# Patient Record
Sex: Male | Born: 1985 | Race: White | Hispanic: No | Marital: Single | Smoking: Never smoker
Health system: Southern US, Community
[De-identification: ages and names within clinical notes are randomized; demographics above are authoritative.]

## PROBLEM LIST (undated history)

## (undated) HISTORY — PX: SHOULDER SURGERY: SHX246

---

## 2013-09-24 ENCOUNTER — Emergency Department
Admission: EM | Admit: 2013-09-24 | Discharge: 2013-09-24 | Disposition: A | Discharge status: Home / Self Care | Payer: BC Managed Care – PPO | Source: Home / Self Care | Attending: Family Medicine | Admitting: Family Medicine

## 2013-09-24 ENCOUNTER — Encounter: Payer: Self-pay | Admitting: Emergency Medicine

## 2013-09-24 DIAGNOSIS — R197 Diarrhea, unspecified: Secondary | ICD-10-CM

## 2013-09-24 DIAGNOSIS — R1011 Right upper quadrant pain: Secondary | ICD-10-CM

## 2013-09-24 DIAGNOSIS — R112 Nausea with vomiting, unspecified: Secondary | ICD-10-CM

## 2013-09-24 LAB — POCT CBC W AUTO DIFF (K'VILLE URGENT CARE)

## 2013-09-24 LAB — POCT URINALYSIS DIP (MANUAL ENTRY)
Bilirubin, UA: NEGATIVE
GLUCOSE UA: NEGATIVE
Ketones, POC UA: NEGATIVE
Leukocytes, UA: NEGATIVE
NITRITE UA: NEGATIVE
PROTEIN UA: NEGATIVE
RBC UA: NEGATIVE
Spec Grav, UA: >=1.03 (ref 1.005–1.03)
UROBILINOGEN UA: 0.2 (ref 0–1)
pH, UA: 6 (ref 5–8)

## 2013-09-24 MED ORDER — DICYCLOMINE HCL 10 MG PO CAPS: ORAL_CAPSULE | ORAL | Status: AC

## 2013-09-24 NOTE — ED Provider Notes (Signed)
CSN: [id number]     Arrival date & time 09/24/13  1320 History   First MD [id number] Contact with Patient 09/24/13 1401     Chief Complaint  Patient presents with  . Abdominal Pain        HPI Comments: Patient complains of 3 to 4 month history of recurring right upper quadrant abdominal pain.  The pain does not radiate.  The discomfort lasts about 20 minutes and occurs several times per week.  The pain can be worse with activity, and improves after drinking water.  The discomfort is worse, and occurs more frequently when he is under increased stress.  He tends to have loose stools alternating with mild constipation, but has seen no blood in his stools (he notes that he has had intermittent mild constipation for about 9 to 10 years).  He also has frequent mild GERD symptoms.  He states that he works at [employer], where he gets free meals and often feels somewhat bloated.  Recently he has removed the bread from his meals at [facility], [facility], [employer] and has noted improvement in his symptoms. Over the past 48 hours he has developed fatigue, chills/sweats with fever to 100.2, nausea with occasional vomiting, watery diarrhea, and increased abdominal cramping.  Denies recent foreign travel, or drinking untreated water in a wilderness environment.   Patient is a 28 y.o. male presenting with cramps. The history is provided by the patient.  Abdominal Cramping This is a chronic problem. Episode onset: 3 to 4 months ago. The problem occurs every several days. The problem has been gradually worsening. Associated symptoms include abdominal pain and headaches. Pertinent negatives include no chest pain and no shortness of breath. The symptoms are aggravated by stress. Relieved by: drinking water. He has tried nothing for the symptoms.    History reviewed. No pertinent past medical history. Past Surgical History  Procedure Laterality Date  . Shoulder surgery     Family History  Problem Relation Age of Onset  .  Diabetes Mother    History  Substance Use Topics  . Smoking status: Never Smoker   . Smokeless tobacco: Never Used  . Alcohol Use: Yes    Review of Systems  Constitutional: Positive for fever, chills, appetite change and fatigue. Negative for diaphoresis, activity change and unexpected weight change.  HENT: Negative for congestion.   Eyes: Negative.   Respiratory: Negative.  Negative for shortness of breath.   Cardiovascular: Negative for chest pain.  Gastrointestinal: Positive for nausea, vomiting, abdominal pain, diarrhea and constipation. Negative for blood in stool and abdominal distention.  Endocrine: Negative.   Genitourinary: Negative.   Musculoskeletal: Negative.   Skin: Negative.   Neurological: Positive for headaches.      Allergies  Review of patient's allergies indicates no known allergies.  Home Medications  No current outpatient prescriptions on file. BP 122/85  Pulse 80  Temp(Src) 98 F (36.7 C) (Oral)  Resp 16  Ht 6' (1.829 m)  Wt 228 lb (103.42 kg)  BMI 30.92 kg/m2  SpO2 100% Physical Exam Nursing notes and Vital Signs reviewed. Appearance:  Patient appears stated age, and in no acute distress.  Patient is obese (BMI 30.9) Eyes:  Pupils are equal, round, and reactive to light and accomodation.  Extraocular movement is intact.  Conjunctivae are not inflamed  Ears:  Canals normal.  Tympanic membranes normal.  Nose: Normal turbinates.  No sinus tenderness.    Pharynx:  Normal Neck:  Supple.  No adenopathy or thyromegaly  Lungs:  Clear to auscultation.  Breath sounds are equal.  Heart:  Regular rate and rhythm without murmurs, rubs, or gallops.  Abdomen:  Mild/minimal peri-umbilical tenderness without masses or hepatosplenomegaly.  Bowel sounds are present.  No CVA or flank tenderness.  Extremities:  No edema.  No calf tenderness Skin:  No rash present.   ED Course  Procedures  none    Labs Reviewed  POCT URINALYSIS DIP (MANUAL ENTRY):  SG >=  1.030, otherwise negative  POCT CBC W AUTO DIFF ([facility]):  WBC 9.2; LY 21.3; MO 10.1; GR 68.6; Hgb 14.8; Platelets 196          MDM   Final diagnoses:  Abdominal pain, right upper quadrant; suspect underlying IBS  Nausea with vomiting; suspect acute viral gastroenteritis  Diarrhea    Begin trial of Bentyl. Begin a BRAT diet until loose stools and nausea resolve (use rice cakes instead of toast), then gradually advance to a regular diet.  Try eliminating all bread products.   If symptoms become significantly worse during the night or over the weekend, proceed to the local emergency room.  Arrange follow-up appt with gastroenterologist.    Lattie Haw, MD 09/26/13 [phone]

## 2013-09-24 NOTE — ED Notes (Signed)
Christopher Werner c/o right abdominal pain, intermittent about 1/week for about 3-4 months. He vomits some mornings with the pain. The pain has become more frequent and worse in severity. This past weekend he feels he has a "GI bug" of N/V/D that is starting to resolve. Pain described as a "knot" in right side.

## 2013-09-24 NOTE — Discharge Instructions (Signed)
Begin a BRAT diet until loose stools and nausea resolve (use rice cakes instead of toast), then gradually advance to a regular diet.  Try eliminating all bread products.   If symptoms become significantly worse during the night or over the weekend, proceed to the local emergency room.    Irritable Bowel Syndrome Irritable Bowel Syndrome (IBS) is caused by a disturbance of normal bowel function. Other terms used are spastic colon, mucous colitis, and irritable colon. It does not require surgery, nor does it lead to cancer. There is no cure for IBS. But with proper diet, stress reduction, and medication, you will find that your problems (symptoms) will gradually disappear or improve. IBS is a common digestive disorder. It usually appears in late adolescence or early adulthood. Women develop it twice as often as men. CAUSES  After food has been digested and absorbed in the small intestine, waste material is moved into the colon (large intestine). In the colon, water and salts are absorbed from the undigested products coming from the small intestine. The remaining residue, or fecal material, is held for elimination. Under normal circumstances, gentle, rhythmic contractions on the bowel walls push the fecal material along the colon towards the rectum. In IBS, however, these contractions are irregular and poorly coordinated. The fecal material is either retained too long, resulting in constipation, or expelled too soon, producing diarrhea. SYMPTOMS  The most common symptom of IBS is pain. It is typically in the lower left side of the belly (abdomen). But it may occur anywhere in the abdomen. It can be felt as heartburn, backache, or even as a dull pain in the arms or shoulders. The pain comes from excessive bowel-muscle spasms and from the buildup of gas and fecal material in the colon. This pain:  Can range from sharp belly (abdominal) cramps to a dull, continuous ache.  Usually worsens soon after  eating.  Is typically relieved by having a bowel movement or passing gas. Abdominal pain is usually accompanied by constipation. But it may also produce diarrhea. The diarrhea typically occurs right after a meal or upon arising in the morning. The stools are typically soft and watery. They are often flecked with secretions (mucus). Other symptoms of IBS include:  Bloating.  Loss of appetite.  Heartburn.  Feeling sick to your stomach (nausea).  Belching  Vomiting  Gas. IBS may also cause a number of symptoms that are unrelated to the digestive system:  Fatigue.  Headaches.  Anxiety  Shortness of breath  Difficulty in concentrating.  Dizziness. These symptoms tend to come and go. DIAGNOSIS  The symptoms of IBS closely mimic the symptoms of other, more serious digestive disorders. So your caregiver may wish to perform a variety of additional tests to exclude these disorders. He/she wants to be certain of learning what is wrong (diagnosis). The nature and purpose of each test will be explained to you. TREATMENT A number of medications are available to help correct bowel function and/or relieve bowel spasms and abdominal pain. Among the drugs available are:  Mild, non-irritating laxatives for severe constipation and to help restore normal bowel habits.  Specific anti-diarrheal medications to treat severe or prolonged diarrhea.  Anti-spasmodic agents to relieve intestinal cramps.  Your caregiver may also decide to treat you with a mild tranquilizer or sedative during unusually stressful periods in your life. The important thing to remember is that if any drug is prescribed for you, make sure that you take it exactly as directed. Make sure that your  caregiver knows how well it worked for you. HOME CARE INSTRUCTIONS   Avoid foods that are high in fat or oils. Some examples JYN:WGNFAare:heavy cream, butter, frankfurters, sausage, and other fatty meats.  Avoid foods that have a  laxative effect, such as fruit, fruit juice, and dairy products.  Cut out carbonated drinks, chewing gum, and "gassy" foods, such as beans and cabbage. This may help relieve bloating and belching.  Bran taken with plenty of liquids may help relieve constipation.  Keep track of what foods seem to trigger your symptoms.  Avoid emotionally charged situations or circumstances that produce anxiety.  Start or continue exercising.  Get plenty of rest and sleep. MAKE SURE YOU:   Understand these instructions.  Will watch your condition.  Will get help right away if you are not doing well or get worse. Document Released: 07/19/2005 Document Revised: 10/11/2011 Document Reviewed: 03/08/2008 [facility] Patient Information 2014 [employer] [place].

## 2015-11-03 DIAGNOSIS — R2241 Localized swelling, mass and lump, right lower limb: Secondary | ICD-10-CM | POA: Diagnosis not present

## 2015-11-10 DIAGNOSIS — F321 Major depressive disorder, single episode, moderate: Secondary | ICD-10-CM | POA: Diagnosis not present

## 2015-11-10 DIAGNOSIS — F419 Anxiety disorder, unspecified: Secondary | ICD-10-CM | POA: Diagnosis not present

## 2015-11-17 DIAGNOSIS — R2241 Localized swelling, mass and lump, right lower limb: Secondary | ICD-10-CM | POA: Diagnosis not present

## 2015-11-17 DIAGNOSIS — L720 Epidermal cyst: Secondary | ICD-10-CM | POA: Diagnosis not present

## 2016-07-21 DIAGNOSIS — M25519 Pain in unspecified shoulder: Secondary | ICD-10-CM | POA: Diagnosis not present

## 2016-07-21 DIAGNOSIS — M25511 Pain in right shoulder: Secondary | ICD-10-CM | POA: Diagnosis not present

## 2016-07-21 DIAGNOSIS — F321 Major depressive disorder, single episode, moderate: Secondary | ICD-10-CM | POA: Diagnosis not present

## 2016-07-21 DIAGNOSIS — H9201 Otalgia, right ear: Secondary | ICD-10-CM | POA: Diagnosis not present

## 2016-08-09 DIAGNOSIS — M7551 Bursitis of right shoulder: Secondary | ICD-10-CM | POA: Diagnosis not present

## 2016-09-29 DIAGNOSIS — J101 Influenza due to other identified influenza virus with other respiratory manifestations: Secondary | ICD-10-CM | POA: Diagnosis not present

## 2016-09-29 DIAGNOSIS — R509 Fever, unspecified: Secondary | ICD-10-CM | POA: Diagnosis not present

## 2017-01-13 DIAGNOSIS — J020 Streptococcal pharyngitis: Secondary | ICD-10-CM | POA: Diagnosis not present

## 2017-05-07 DIAGNOSIS — K625 Hemorrhage of anus and rectum: Secondary | ICD-10-CM | POA: Diagnosis not present

## 2017-05-07 DIAGNOSIS — R1084 Generalized abdominal pain: Secondary | ICD-10-CM | POA: Diagnosis not present

## 2017-05-24 DIAGNOSIS — Z23 Encounter for immunization: Secondary | ICD-10-CM | POA: Diagnosis not present

## 2017-09-14 DIAGNOSIS — R1111 Vomiting without nausea: Secondary | ICD-10-CM | POA: Diagnosis not present

## 2017-09-14 DIAGNOSIS — J069 Acute upper respiratory infection, unspecified: Secondary | ICD-10-CM | POA: Diagnosis not present

## 2017-10-25 DIAGNOSIS — J0110 Acute frontal sinusitis, unspecified: Secondary | ICD-10-CM | POA: Diagnosis not present

## 2018-01-11 DIAGNOSIS — M25561 Pain in right knee: Secondary | ICD-10-CM | POA: Diagnosis not present

## 2018-01-23 DIAGNOSIS — L03116 Cellulitis of left lower limb: Secondary | ICD-10-CM | POA: Diagnosis not present

## 2018-04-24 DIAGNOSIS — F0781 Postconcussional syndrome: Secondary | ICD-10-CM | POA: Diagnosis not present

## 2018-04-25 ENCOUNTER — Other Ambulatory Visit: Payer: Self-pay | Admitting: Family Medicine

## 2018-04-25 ENCOUNTER — Ambulatory Visit
Admission: RE | Admit: 2018-04-25 | Discharge: 2018-04-25 | Disposition: A | Discharge status: Home / Self Care | Payer: BLUE CROSS/BLUE SHIELD | Source: Ambulatory Visit | Attending: Family Medicine | Admitting: Family Medicine

## 2018-04-25 DIAGNOSIS — S060X0A Concussion without loss of consciousness, initial encounter: Secondary | ICD-10-CM

## 2018-04-25 DIAGNOSIS — R410 Disorientation, unspecified: Secondary | ICD-10-CM | POA: Diagnosis not present

## 2018-05-10 DIAGNOSIS — Z23 Encounter for immunization: Secondary | ICD-10-CM | POA: Diagnosis not present

## 2018-06-21 DIAGNOSIS — F411 Generalized anxiety disorder: Secondary | ICD-10-CM | POA: Diagnosis not present

## 2018-06-21 DIAGNOSIS — F329 Major depressive disorder, single episode, unspecified: Secondary | ICD-10-CM | POA: Diagnosis not present

## 2018-07-19 DIAGNOSIS — F329 Major depressive disorder, single episode, unspecified: Secondary | ICD-10-CM | POA: Diagnosis not present

## 2018-07-19 DIAGNOSIS — F411 Generalized anxiety disorder: Secondary | ICD-10-CM | POA: Diagnosis not present

## 2018-07-19 DIAGNOSIS — E663 Overweight: Secondary | ICD-10-CM | POA: Diagnosis not present

## 2019-01-17 DIAGNOSIS — F329 Major depressive disorder, single episode, unspecified: Secondary | ICD-10-CM | POA: Diagnosis not present

## 2019-01-17 DIAGNOSIS — F411 Generalized anxiety disorder: Secondary | ICD-10-CM | POA: Diagnosis not present

## 2019-05-04 DIAGNOSIS — Z23 Encounter for immunization: Secondary | ICD-10-CM | POA: Diagnosis not present

## 2019-05-14 DIAGNOSIS — F411 Generalized anxiety disorder: Secondary | ICD-10-CM | POA: Diagnosis not present

## 2019-05-14 DIAGNOSIS — F329 Major depressive disorder, single episode, unspecified: Secondary | ICD-10-CM | POA: Diagnosis not present

## 2019-08-21 DIAGNOSIS — R05 Cough: Secondary | ICD-10-CM | POA: Diagnosis not present

## 2019-08-21 DIAGNOSIS — Z03818 Encounter for observation for suspected exposure to other biological agents ruled out: Secondary | ICD-10-CM | POA: Diagnosis not present

## 2019-08-21 DIAGNOSIS — R110 Nausea: Secondary | ICD-10-CM | POA: Diagnosis not present

## 2019-08-21 DIAGNOSIS — R1032 Left lower quadrant pain: Secondary | ICD-10-CM | POA: Diagnosis not present

## 2019-08-22 ENCOUNTER — Ambulatory Visit
Admission: RE | Admit: 2019-08-22 | Discharge: 2019-08-22 | Disposition: A | Discharge status: Home / Self Care | Payer: Self-pay | Source: Ambulatory Visit | Attending: Physician Assistant | Admitting: Physician Assistant

## 2019-08-22 ENCOUNTER — Other Ambulatory Visit: Payer: Self-pay

## 2019-08-22 ENCOUNTER — Other Ambulatory Visit: Payer: Self-pay | Admitting: Physician Assistant

## 2019-08-22 DIAGNOSIS — R05 Cough: Secondary | ICD-10-CM

## 2019-08-22 DIAGNOSIS — R059 Cough, unspecified: Secondary | ICD-10-CM

## 2020-07-01 DIAGNOSIS — Z13220 Encounter for screening for lipoid disorders: Secondary | ICD-10-CM | POA: Diagnosis not present

## 2020-07-01 DIAGNOSIS — F411 Generalized anxiety disorder: Secondary | ICD-10-CM | POA: Diagnosis not present

## 2020-07-01 DIAGNOSIS — F1290 Cannabis use, unspecified, uncomplicated: Secondary | ICD-10-CM | POA: Diagnosis not present

## 2020-07-01 DIAGNOSIS — E663 Overweight: Secondary | ICD-10-CM | POA: Diagnosis not present

## 2020-07-01 DIAGNOSIS — Z131 Encounter for screening for diabetes mellitus: Secondary | ICD-10-CM | POA: Diagnosis not present

## 2020-07-01 DIAGNOSIS — F329 Major depressive disorder, single episode, unspecified: Secondary | ICD-10-CM | POA: Diagnosis not present

## 2020-07-20 IMAGING — CR DG CHEST 2V
2 series · 2 of 2 positions shown · non-contrast
Comparison: No priors.

CLINICAL DATA: 33-year-old male with history of dry cough for 1
month.

EXAM:
CHEST - 2 VIEW

[w chest pa]
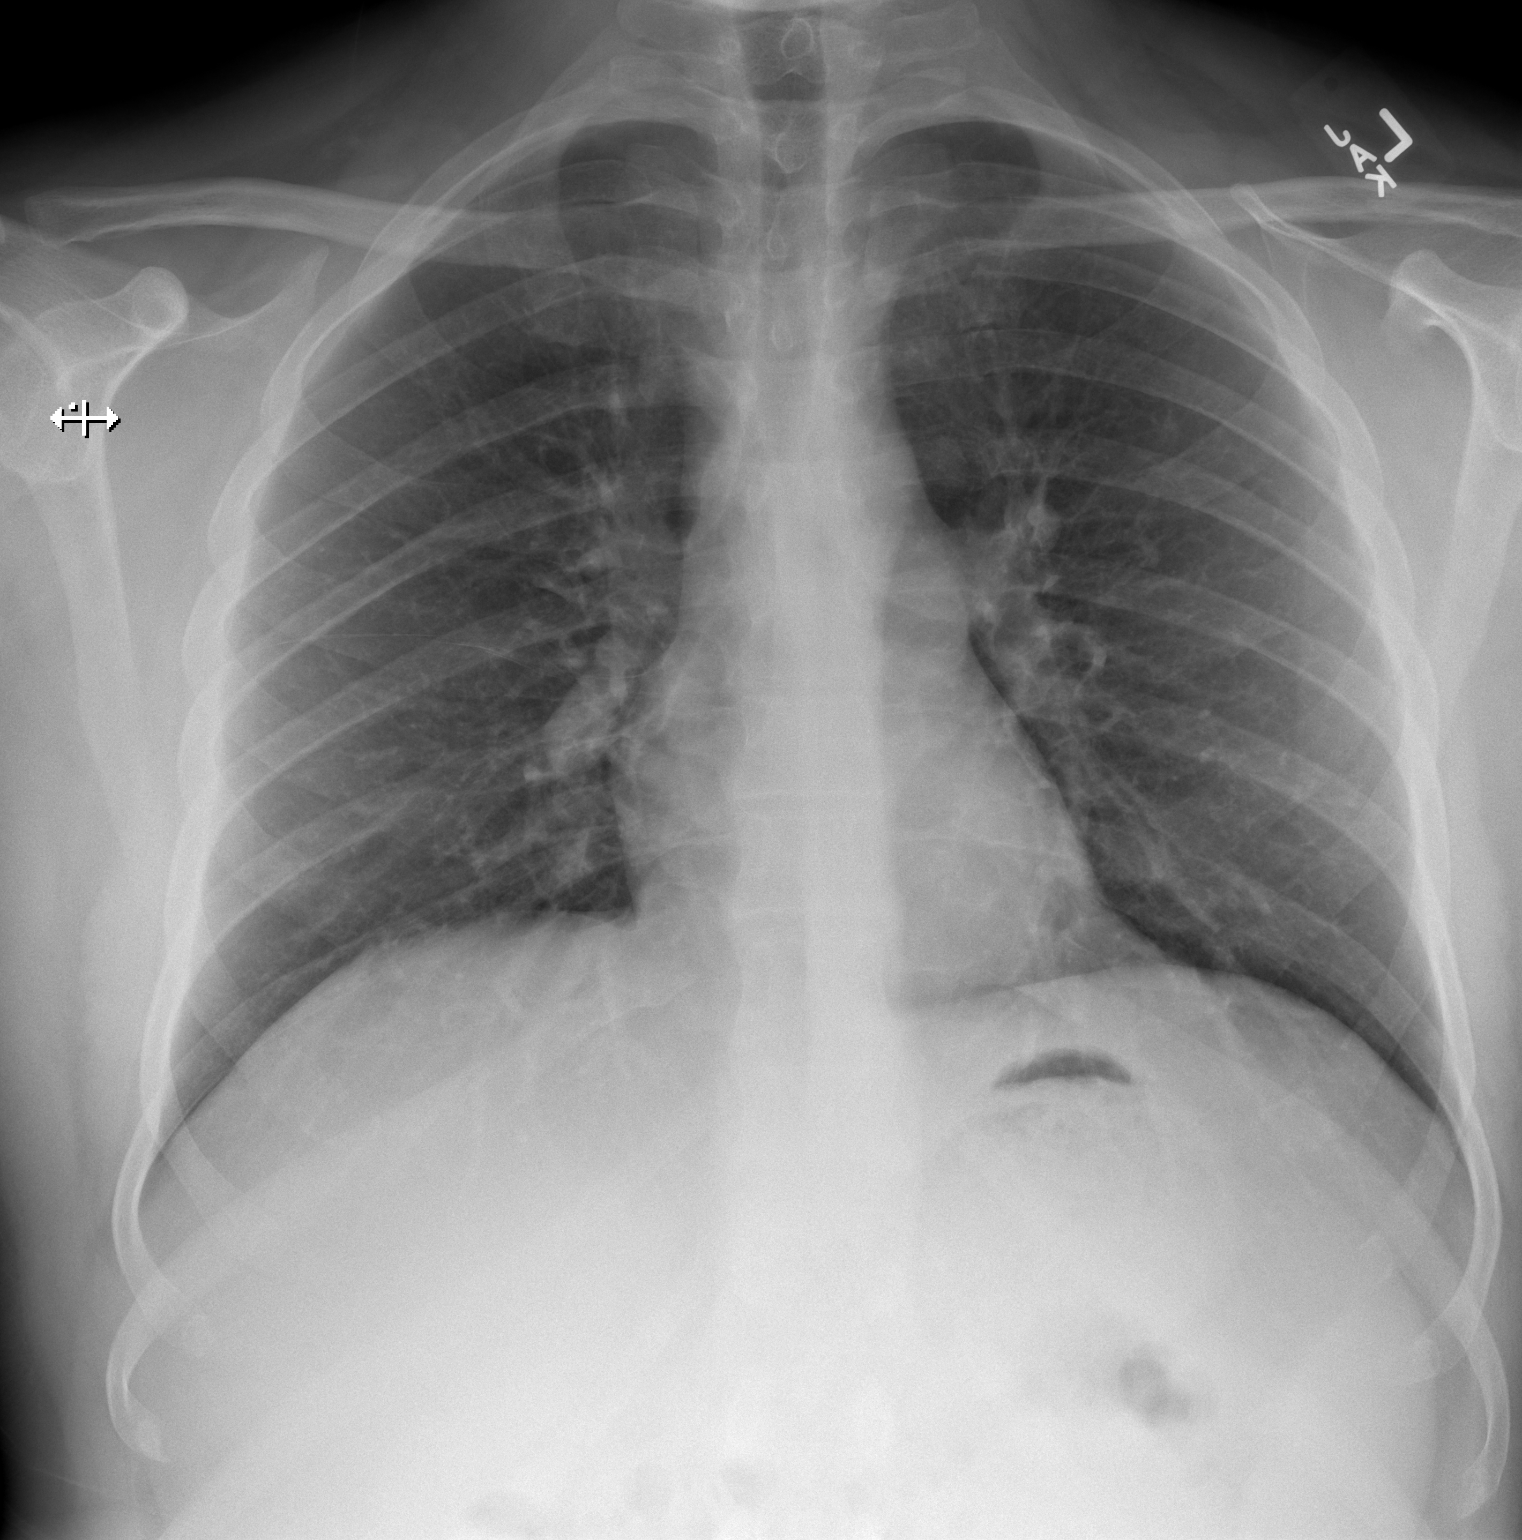

[w chest lat]
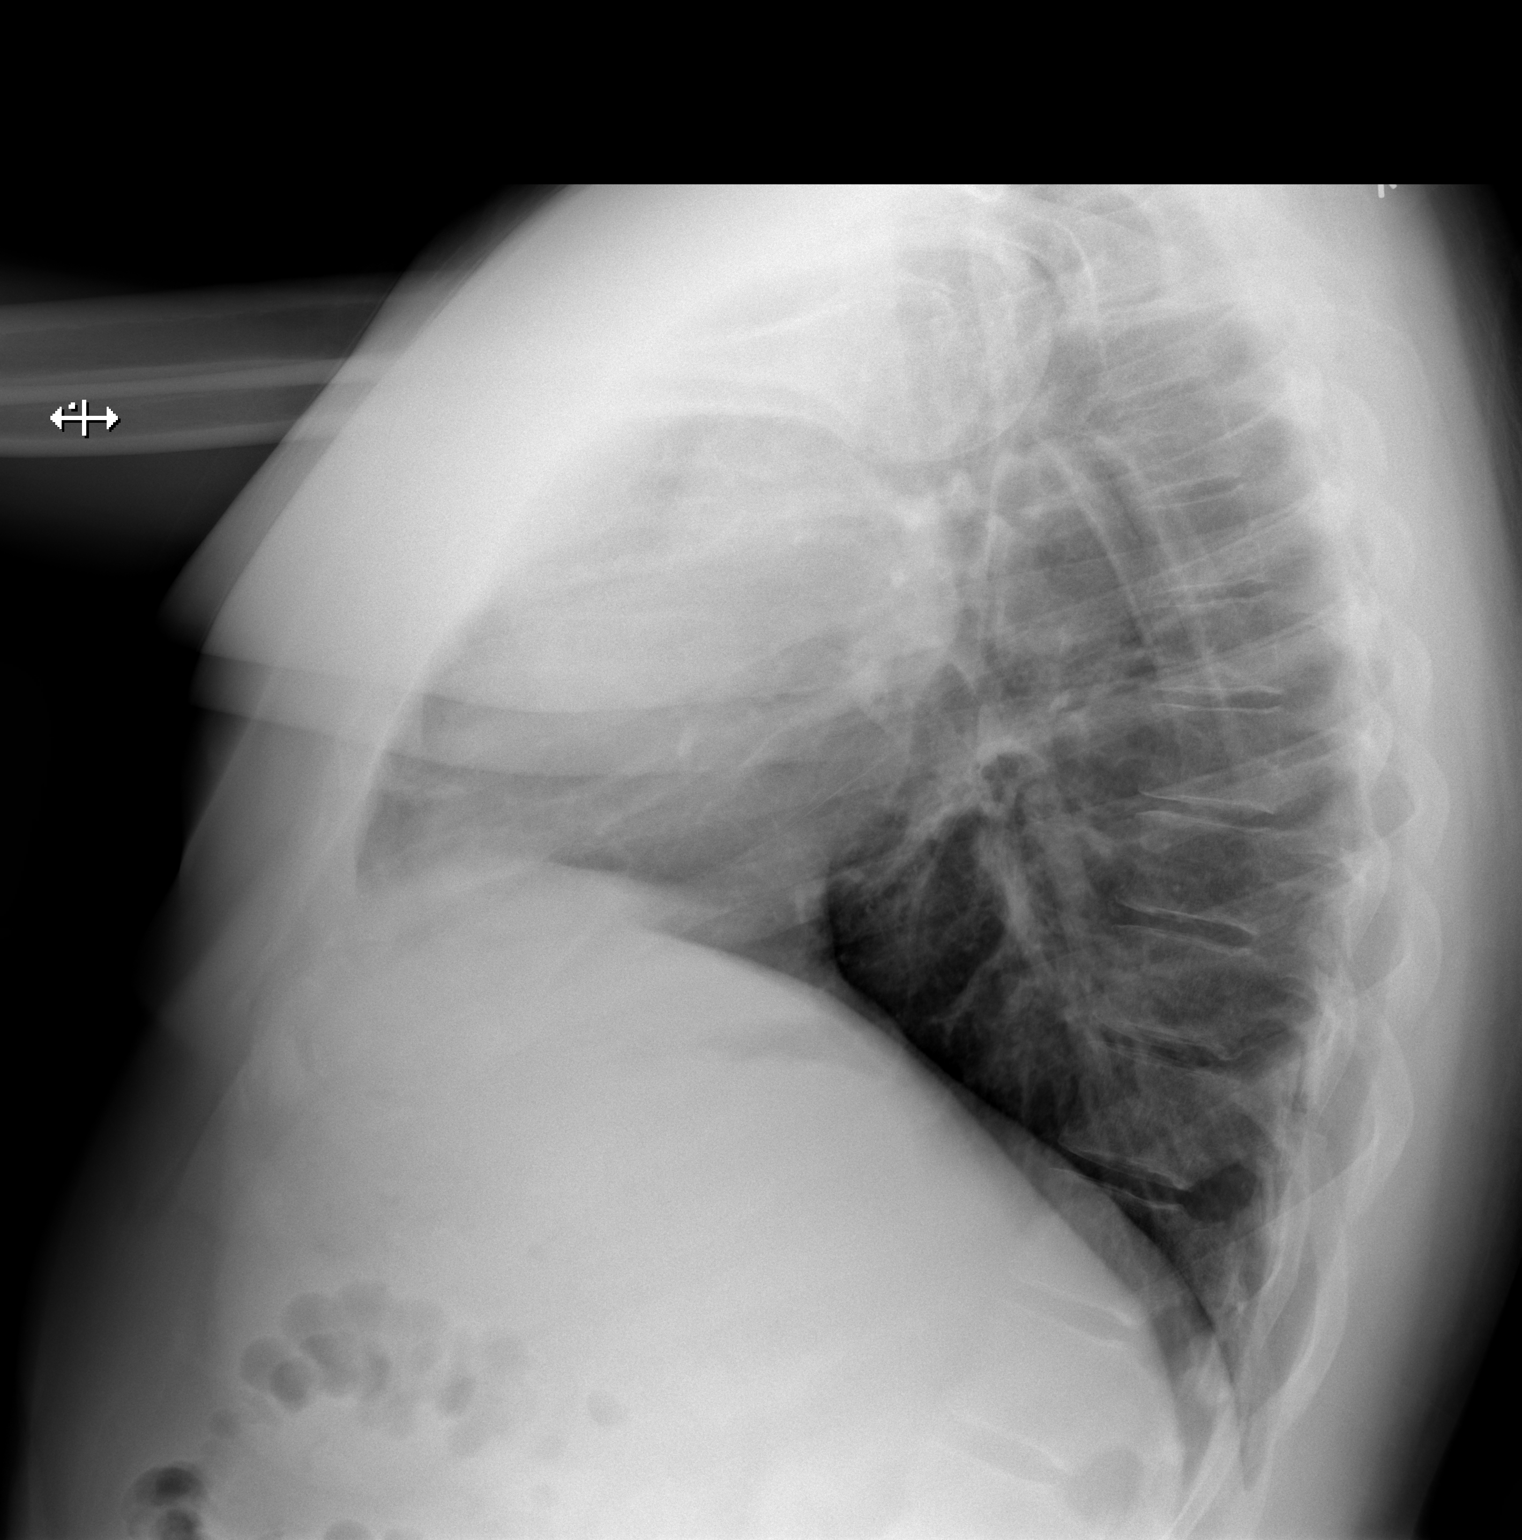

[2 of 2 positions shown; findings below may reference images not displayed]

FINDINGS: Lung volumes are normal. No consolidative airspace disease. No
pleural effusions. No pneumothorax. No pulmonary nodule or mass
noted. Pulmonary vasculature and the cardiomediastinal silhouette
are within normal limits.
IMPRESSION: No radiographic evidence of acute cardiopulmonary disease.

## 2020-11-17 DIAGNOSIS — F4320 Adjustment disorder, unspecified: Secondary | ICD-10-CM | POA: Diagnosis not present

## 2020-11-26 DIAGNOSIS — F4320 Adjustment disorder, unspecified: Secondary | ICD-10-CM | POA: Diagnosis not present

## 2020-12-01 DIAGNOSIS — F4320 Adjustment disorder, unspecified: Secondary | ICD-10-CM | POA: Diagnosis not present

## 2020-12-08 DIAGNOSIS — F4320 Adjustment disorder, unspecified: Secondary | ICD-10-CM | POA: Diagnosis not present

## 2020-12-18 DIAGNOSIS — F4320 Adjustment disorder, unspecified: Secondary | ICD-10-CM | POA: Diagnosis not present

## 2020-12-24 DIAGNOSIS — F4320 Adjustment disorder, unspecified: Secondary | ICD-10-CM | POA: Diagnosis not present

## 2020-12-30 DIAGNOSIS — F4320 Adjustment disorder, unspecified: Secondary | ICD-10-CM | POA: Diagnosis not present

## 2021-01-02 DIAGNOSIS — F411 Generalized anxiety disorder: Secondary | ICD-10-CM | POA: Diagnosis not present

## 2021-01-02 DIAGNOSIS — E663 Overweight: Secondary | ICD-10-CM | POA: Diagnosis not present

## 2021-01-02 DIAGNOSIS — F329 Major depressive disorder, single episode, unspecified: Secondary | ICD-10-CM | POA: Diagnosis not present

## 2021-01-08 DIAGNOSIS — F4320 Adjustment disorder, unspecified: Secondary | ICD-10-CM | POA: Diagnosis not present

## 2021-01-22 DIAGNOSIS — F4320 Adjustment disorder, unspecified: Secondary | ICD-10-CM | POA: Diagnosis not present

## 2021-02-16 DIAGNOSIS — F4320 Adjustment disorder, unspecified: Secondary | ICD-10-CM | POA: Diagnosis not present

## 2021-03-02 DIAGNOSIS — F4320 Adjustment disorder, unspecified: Secondary | ICD-10-CM | POA: Diagnosis not present

## 2021-03-19 DIAGNOSIS — F4320 Adjustment disorder, unspecified: Secondary | ICD-10-CM | POA: Diagnosis not present

## 2021-04-28 DIAGNOSIS — F4320 Adjustment disorder, unspecified: Secondary | ICD-10-CM | POA: Diagnosis not present

## 2021-05-07 DIAGNOSIS — F4320 Adjustment disorder, unspecified: Secondary | ICD-10-CM | POA: Diagnosis not present

## 2021-05-21 DIAGNOSIS — F4320 Adjustment disorder, unspecified: Secondary | ICD-10-CM | POA: Diagnosis not present

## 2021-06-04 DIAGNOSIS — F4320 Adjustment disorder, unspecified: Secondary | ICD-10-CM | POA: Diagnosis not present

## 2021-07-06 DIAGNOSIS — F329 Major depressive disorder, single episode, unspecified: Secondary | ICD-10-CM | POA: Diagnosis not present

## 2021-07-06 DIAGNOSIS — F411 Generalized anxiety disorder: Secondary | ICD-10-CM | POA: Diagnosis not present

## 2021-07-06 DIAGNOSIS — R0683 Snoring: Secondary | ICD-10-CM | POA: Diagnosis not present

## 2021-08-11 DIAGNOSIS — G4733 Obstructive sleep apnea (adult) (pediatric): Secondary | ICD-10-CM | POA: Diagnosis not present

## 2021-09-01 DIAGNOSIS — R1084 Generalized abdominal pain: Secondary | ICD-10-CM | POA: Diagnosis not present

## 2021-09-01 DIAGNOSIS — Z03818 Encounter for observation for suspected exposure to other biological agents ruled out: Secondary | ICD-10-CM | POA: Diagnosis not present

## 2021-09-01 DIAGNOSIS — R059 Cough, unspecified: Secondary | ICD-10-CM | POA: Diagnosis not present

## 2021-09-01 DIAGNOSIS — J189 Pneumonia, unspecified organism: Secondary | ICD-10-CM | POA: Diagnosis not present

## 2021-09-01 DIAGNOSIS — J029 Acute pharyngitis, unspecified: Secondary | ICD-10-CM | POA: Diagnosis not present

## 2021-09-01 DIAGNOSIS — R509 Fever, unspecified: Secondary | ICD-10-CM | POA: Diagnosis not present

## 2021-09-01 DIAGNOSIS — R052 Subacute cough: Secondary | ICD-10-CM | POA: Diagnosis not present

## 2021-09-01 DIAGNOSIS — J019 Acute sinusitis, unspecified: Secondary | ICD-10-CM | POA: Diagnosis not present

## 2021-09-23 DIAGNOSIS — G4733 Obstructive sleep apnea (adult) (pediatric): Secondary | ICD-10-CM | POA: Diagnosis not present

## 2021-11-13 DIAGNOSIS — Z23 Encounter for immunization: Secondary | ICD-10-CM | POA: Diagnosis not present

## 2022-03-26 DIAGNOSIS — F329 Major depressive disorder, single episode, unspecified: Secondary | ICD-10-CM | POA: Diagnosis not present

## 2022-03-26 DIAGNOSIS — F411 Generalized anxiety disorder: Secondary | ICD-10-CM | POA: Diagnosis not present

## 2022-03-26 DIAGNOSIS — R5383 Other fatigue: Secondary | ICD-10-CM | POA: Diagnosis not present

## 2022-05-10 DIAGNOSIS — R5383 Other fatigue: Secondary | ICD-10-CM | POA: Diagnosis not present

## 2022-05-28 DIAGNOSIS — E559 Vitamin D deficiency, unspecified: Secondary | ICD-10-CM | POA: Diagnosis not present

## 2022-05-28 DIAGNOSIS — E291 Testicular hypofunction: Secondary | ICD-10-CM | POA: Diagnosis not present

## 2022-05-28 DIAGNOSIS — R946 Abnormal results of thyroid function studies: Secondary | ICD-10-CM | POA: Diagnosis not present

## 2022-07-06 DIAGNOSIS — K529 Noninfective gastroenteritis and colitis, unspecified: Secondary | ICD-10-CM | POA: Diagnosis not present

## 2022-07-06 DIAGNOSIS — K219 Gastro-esophageal reflux disease without esophagitis: Secondary | ICD-10-CM | POA: Diagnosis not present

## 2022-09-20 DIAGNOSIS — E559 Vitamin D deficiency, unspecified: Secondary | ICD-10-CM | POA: Diagnosis not present

## 2022-09-20 DIAGNOSIS — R946 Abnormal results of thyroid function studies: Secondary | ICD-10-CM | POA: Diagnosis not present

## 2022-09-20 DIAGNOSIS — G4733 Obstructive sleep apnea (adult) (pediatric): Secondary | ICD-10-CM | POA: Diagnosis not present

## 2022-09-20 DIAGNOSIS — E291 Testicular hypofunction: Secondary | ICD-10-CM | POA: Diagnosis not present

## 2022-09-27 DIAGNOSIS — R946 Abnormal results of thyroid function studies: Secondary | ICD-10-CM | POA: Diagnosis not present

## 2022-09-27 DIAGNOSIS — E669 Obesity, unspecified: Secondary | ICD-10-CM | POA: Diagnosis not present

## 2022-09-27 DIAGNOSIS — E291 Testicular hypofunction: Secondary | ICD-10-CM | POA: Diagnosis not present

## 2022-09-27 DIAGNOSIS — E559 Vitamin D deficiency, unspecified: Secondary | ICD-10-CM | POA: Diagnosis not present

## 2022-10-04 ENCOUNTER — Other Ambulatory Visit: Payer: Self-pay | Admitting: Internal Medicine

## 2022-10-04 DIAGNOSIS — E230 Hypopituitarism: Secondary | ICD-10-CM

## 2022-10-08 ENCOUNTER — Ambulatory Visit
Admission: RE | Admit: 2022-10-08 | Discharge: 2022-10-08 | Disposition: A | Discharge status: Home / Self Care | Payer: BC Managed Care – PPO | Source: Ambulatory Visit | Attending: Internal Medicine | Admitting: Internal Medicine

## 2022-10-08 DIAGNOSIS — E230 Hypopituitarism: Secondary | ICD-10-CM

## 2022-10-08 MED ORDER — GADOPICLENOL 0.5 MMOL/ML IV SOLN
10.0000 mL | Freq: Once | INTRAVENOUS | Status: AC | PRN
  Administered 2022-10-08: 10 mL via INTRAVENOUS

## 2023-02-01 DIAGNOSIS — E291 Testicular hypofunction: Secondary | ICD-10-CM | POA: Diagnosis not present

## 2023-02-01 DIAGNOSIS — E559 Vitamin D deficiency, unspecified: Secondary | ICD-10-CM | POA: Diagnosis not present

## 2023-02-01 DIAGNOSIS — R946 Abnormal results of thyroid function studies: Secondary | ICD-10-CM | POA: Diagnosis not present

## 2023-02-24 DIAGNOSIS — N62 Hypertrophy of breast: Secondary | ICD-10-CM | POA: Diagnosis not present

## 2023-02-24 DIAGNOSIS — E291 Testicular hypofunction: Secondary | ICD-10-CM | POA: Diagnosis not present

## 2024-03-27 ENCOUNTER — Ambulatory Visit: Admitting: Internal Medicine

## 2024-03-30 ENCOUNTER — Ambulatory Visit (HOSPITAL_COMMUNITY)
Admission: EM | Admit: 2024-03-30 | Discharge: 2024-03-30 | Disposition: A | Discharge status: Home / Self Care | Payer: Self-pay

## 2024-03-30 ENCOUNTER — Ambulatory Visit (INDEPENDENT_AMBULATORY_CARE_PROVIDER_SITE_OTHER): Payer: Self-pay

## 2024-03-30 ENCOUNTER — Encounter (HOSPITAL_COMMUNITY): Payer: Self-pay | Admitting: Emergency Medicine

## 2024-03-30 DIAGNOSIS — M25551 Pain in right hip: Secondary | ICD-10-CM

## 2024-03-30 MED ORDER — DICLOFENAC SODIUM 50 MG PO TBEC: 50.0000 mg | DELAYED_RELEASE_TABLET | Freq: Two times a day (BID) | ORAL | 1 refills | Status: AC

## 2024-03-30 MED ORDER — BACLOFEN 10 MG PO TABS: 10.0000 mg | ORAL_TABLET | Freq: Three times a day (TID) | ORAL | 0 refills | Status: AC

## 2024-03-30 NOTE — Discharge Instructions (Addendum)
  1. Greater trochanteric pain syndrome of right lower extremity (Primary) - DG Femur Min 2 Views Right x-ray performed in UC shows no acute fracture or dislocation right hip, possible concern for avascular necrosis of the femoral head, may need follow-up with orthopedics for advanced imaging to rule out as the potential cause of symptoms. - diclofenac  (VOLTAREN ) 50 MG EC tablet; Take 1 tablet (50 mg total) by mouth 2 (two) times daily.  Dispense: 30 tablet; Refill: 1 - baclofen  (LIORESAL ) 10 MG tablet; Take 1 tablet (10 mg total) by mouth 3 (three) times daily.  Dispense: 30 each; Refill: 0 - AMB referral to orthopedics for further evaluation and management of ongoing right hip/sciatic nerve pain with possible concern for avascular necrosis of the femoral head. -Continue to monitor symptoms for any change in severity if there is any escalation of current symptoms or development of new symptoms follow-up in ER for further evaluation and management.

## 2024-03-30 NOTE — ED Triage Notes (Signed)
 Pt c/o pain in right hip for approx 3 weeks  St's today pain is constant.  No known injury

## 2024-03-30 NOTE — ED Provider Notes (Addendum)
 [facility]  Note:  This document was prepared using Dragon voice recognition software and may include unintentional dictation errors.  MRN: [id number] DOB: 1986/04/29  Subjective:   Christopher Werner is a 38 y.o. male presenting for right sided lateral hip pain with radiation to posterior glute and anterior thigh for approximately 3 weeks.  Patient reports that pain is constant and increases with ambulation.  Patient denies any known injury or trauma.  Patient has not been taking any over-the-counter medication to treat symptoms.  Patient denies any past history of sciatica with radiculopathy.  No lower back pain.  No shortness of breath, chest pain, weakness, dizziness.  No current facility-administered medications for this encounter.  Current Outpatient Medications:    baclofen  (LIORESAL ) 10 MG tablet, Take 1 tablet (10 mg total) by mouth 3 (three) times daily., Disp: 30 each, Rfl: 0   diclofenac  (VOLTAREN ) 50 MG EC tablet, Take 1 tablet (50 mg total) by mouth 2 (two) times daily., Disp: 30 tablet, Rfl: 1   dicyclomine  (BENTYL ) 10 MG capsule, Take one cap by mouth BID to TID prn abdominal pain (Patient not taking: Reported on 03/30/2024), Disp: 15 capsule, Rfl: 1   No Known Allergies  History reviewed. No pertinent past medical history.   Past Surgical History:  Procedure Laterality Date   SHOULDER SURGERY      Family History  Problem Relation Age of Onset   Diabetes Mother     Social History   Tobacco Use   Smoking status: Never   Smokeless tobacco: Never  Vaping Use   Vaping status: Some Days  Substance Use Topics   Alcohol use: Yes   Drug use: No    ROS Refer to HPI for ROS details.  Objective:   Vitals: BP (!) 143/85 (BP Location: Left Arm)   Pulse 88   Temp 97.9 F (36.6 C) (Oral)   Resp 18   SpO2 96%   Physical Exam Vitals and nursing note reviewed.  Constitutional:      General: He is not in acute distress.    Appearance: Normal  appearance. He is not ill-appearing or toxic-appearing.  HENT:     Head: Normocephalic.  Cardiovascular:     Rate and Rhythm: Normal rate.  Pulmonary:     Effort: Pulmonary effort is normal. No respiratory distress.  Musculoskeletal:     Right hip: Tenderness and bony tenderness present. No deformity, lacerations or crepitus. Normal range of motion. Normal strength.     Right upper leg: No swelling, deformity, tenderness or bony tenderness.  Skin:    General: Skin is warm and dry.     Capillary Refill: Capillary refill takes less than 2 seconds.  Neurological:     General: No focal deficit present.     Mental Status: He is alert and oriented to person, place, and time.  Psychiatric:        Mood and Affect: Mood normal.        Behavior: Behavior normal.     Procedures  No results found for this or any previous visit (from the past 24 hours).  DG Femur Min 2 Views Right Result Date: 03/30/2024 CLINICAL DATA:  Right hip pain for past 3 weeks.  No known injury. EXAM: RIGHT FEMUR 2 VIEWS COMPARISON:  None Available. FINDINGS: Sclerosis and lucency in the superomedial aspects of the right femoral head. This is in an area of bony overlap with the acetabulum. No similar changes seen in the partially included portion of  the left femoral head. The remainder of the examination is unremarkable. IMPRESSION: Sclerosis and lucency in the superomedial aspects of the right femoral head. 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 The appearance is less likely due to bony overlap with the acetabulum. If clinically indicated, this could be determined with certainty with a noncontrast CT of the pelvis or right hip or with right hip MRI. Electronically Signed   By: Elspeth Bathe M.D.   On: 03/30/2024 13:07     Assessment and Plan :     Discharge Instructions       1. Greater trochanteric pain syndrome of right lower extremity (Primary) - DG Femur Min 2 Views Right x-ray  performed in UC shows no acute fracture or dislocation right hip, possible concern for avascular necrosis of the femoral head, may need follow-up with orthopedics for advanced imaging to rule out as the potential cause of symptoms. - diclofenac  (VOLTAREN ) 50 MG EC tablet; Take 1 tablet (50 mg total) by mouth 2 (two) times daily.  Dispense: 30 tablet; Refill: 1 - baclofen  (LIORESAL ) 10 MG tablet; Take 1 tablet (10 mg total) by mouth 3 (three) times daily.  Dispense: 30 each; Refill: 0 - AMB referral to orthopedics for further evaluation and management of ongoing right hip/sciatic nerve pain with possible concern for avascular necrosis of the femoral head. -Continue to monitor symptoms for any change in severity if there is any escalation of current symptoms or development of new symptoms follow-up in ER for further evaluation and management.      Shannia Jacuinde B Luisangel Wainright   Shawnee Higham, [place] B, NP 03/30/24 1305    Aurea Goodell B, NP 03/30/24 1315

## 2024-05-03 ENCOUNTER — Ambulatory Visit: Payer: Self-pay | Admitting: Physician Assistant

## 2024-05-03 DIAGNOSIS — M87051 Idiopathic aseptic necrosis of right femur: Secondary | ICD-10-CM | POA: Diagnosis not present

## 2024-05-03 MED ORDER — TRAMADOL HCL 50 MG PO TABS: 50.0000 mg | ORAL_TABLET | Freq: Two times a day (BID) | ORAL | 1 refills | Status: AC | PRN

## 2024-05-03 MED ORDER — PREDNISONE 10 MG (21) PO TBPK: ORAL_TABLET | ORAL | 0 refills | Status: AC

## 2024-05-03 NOTE — Progress Notes (Signed)
 Office Visit Note   Patient: Christopher Werner           Date of Birth: 1985/11/04           MRN: [id number] Visit Date: 05/03/2024              Requested by: Aurea Ethel NOVAK, NP [address] [place],  [place] [address] PCP: Patient, No Pcp Per   Assessment & Plan: Visit Diagnoses:  1. Avascular necrosis of bone of right hip (HCC)     Plan: Impression is right hip avascular necrosis.  We have discussed possible treatment options to include cortisone injection versus probable total hip replacement at some point in the future.  At this time, the patient does not have very good insurance and would like to hold off on injection for now.  He would like to try course of oral steroids.  Once he has better insurance, he will let us  know and we will possibly make a referral to Dr. Burnetta for ultrasound-guided cortisone injection of the right hip joint.  He is aware that this could cause worsening of his AVN.  Follow-Up Instructions: Return if symptoms worsen or fail to improve.   Orders:  No orders of the defined types were placed in this encounter.  Meds ordered this encounter  Medications   predniSONE (STERAPRED UNI-PAK 21 TAB) 10 MG (21) TBPK tablet    Sig: Take as directed    Dispense:  21 tablet    Refill:  0   traMADol (ULTRAM) 50 MG tablet    Sig: Take 1 tablet (50 mg total) by mouth every 12 (twelve) hours as needed.    Dispense:  30 tablet    Refill:  1      Procedures: No procedures performed   Clinical Data: No additional findings.   Subjective: Chief Complaint  Patient presents with   Right Hip - Pain   Lower Back - Pain    HPI patient is a pleasant 38 year old gentleman who comes in today with right hip pain.  Symptoms began a few months ago and have recently worsened.  The pain he has is primarily to the groin as well as the anterior thigh and occasionally radiates to the knee.  Symptoms are constant but worse when he is sleeping as well as at the end  of the day after he has been on his feet all day at work.  He has tried NSAIDs and muscle relaxers with little relief.  He has not been on chronic steroids but does admit to drinking alcohol.  Review of Systems as detailed in HPI.  All others reviewed and are negative.   Objective: Vital Signs: There were no vitals taken for this visit.  Physical Exam well-developed well-nourished gentleman in no acute distress.  Alert and oriented x 3.  Ortho Exam right hip exam: Moderate pain with logroll and FADIR testing.  No tenderness to lateral hip.  He is neurovascularly intact distally.  Specialty Comments:  No specialty comments available.  Imaging: Right femur x-rays reveal by me show evidence of AVN to the femoral head.   PMFS History: There are no active problems to display for this patient.  No past medical history on file.  Family History  Problem Relation Age of Onset   Diabetes Mother     Past Surgical History:  Procedure Laterality Date   SHOULDER SURGERY     Social History   Occupational History   Not on file  Tobacco Use   Smoking status: Never   Smokeless tobacco: Never  Vaping Use   Vaping status: Some Days  Substance and Sexual Activity   Alcohol use: Yes   Drug use: No   Sexual activity: Not on file

## 2024-06-04 ENCOUNTER — Encounter: Payer: Self-pay | Admitting: Radiology

## 2024-08-06 ENCOUNTER — Other Ambulatory Visit: Payer: Self-pay | Admitting: Physician Assistant

## 2024-08-06 DIAGNOSIS — M25551 Pain in right hip: Secondary | ICD-10-CM

## 2024-08-07 ENCOUNTER — Other Ambulatory Visit (HOSPITAL_COMMUNITY): Payer: Self-pay | Admitting: Physician Assistant

## 2024-08-07 DIAGNOSIS — M25551 Pain in right hip: Secondary | ICD-10-CM

## 2024-08-08 ENCOUNTER — Ambulatory Visit (HOSPITAL_COMMUNITY)
Admission: RE | Admit: 2024-08-08 | Discharge: 2024-08-08 | Disposition: A | Discharge status: Home / Self Care | Source: Ambulatory Visit | Attending: Physician Assistant | Admitting: Physician Assistant

## 2024-08-08 DIAGNOSIS — M25551 Pain in right hip: Secondary | ICD-10-CM | POA: Diagnosis present

## 2024-08-21 ENCOUNTER — Telehealth: Payer: Self-pay

## 2024-08-21 ENCOUNTER — Ambulatory Visit: Admitting: Physician Assistant

## 2024-08-21 DIAGNOSIS — M87051 Idiopathic aseptic necrosis of right femur: Secondary | ICD-10-CM

## 2024-08-21 MED ORDER — TRAMADOL HCL 50 MG PO TABS: 50.0000 mg | ORAL_TABLET | Freq: Three times a day (TID) | ORAL | 1 refills | Status: AC | PRN

## 2024-08-21 NOTE — Progress Notes (Signed)
" ° °  Office Visit Note   Patient: Christopher Werner           Date of Birth: [birth year]           MRN: [id number] Visit Date: 08/21/2024              Requested by: Turmel, Usaa, PA [address] [place],  [place] [address] PCP: Wonda Worth SQUIBB, PA   Assessment & Plan: Visit Diagnoses:  1. Avascular necrosis of bone of right hip (HCC)     Plan: Impression is right hip avascular necrosis.  Patient has had worsening symptoms over the past few months.  Based on his symptoms and MRI results, I have recommended total hip replacement surgery.  He would like to proceed to soon as possible.  He has not previously undergone cortisone injections right hip.  He will follow-up with Dr. Jerri to further discuss this.  We will however ahead and provide him with a clearance form for his PCP to expedite the process.  I sent in tramadol  to take as needed for pain.  He will let me know if he needs something stronger.  Call with any concerns or questions.  Follow-Up Instructions: Return for with XU to schedule right THA.   Orders:  No orders of the defined types were placed in this encounter.  Meds ordered this encounter  Medications   traMADol  (ULTRAM ) 50 MG tablet    Sig: Take 1-2 tablets (50-100 mg total) by mouth 3 (three) times daily as needed.    Dispense:  60 tablet    Refill:  1      Procedures: No procedures performed   Clinical Data: No additional findings.   Subjective: Chief Complaint  Patient presents with   Right Hip - Pain    MRI review    HPI patient is a very pleasant 39 year old gentleman who comes in today with continued right hip pain.  History of avascular necrosis confirmed on recent MRI with associated large joint effusion.  He continues to have worsening pain primarily in the groin and anterior thigh.  Pain is constant but does appear to be worse with activity as well as when he is twisting his leg in a certain direction.  He completed 2 rounds of prednisone   which only temporarily helped.  Currently taking Tylenol without relief.  Review of Systems as detailed in HPI.  All others reviewed and are negative.   Objective: Vital Signs: There were no vitals taken for this visit.  Physical Exam well-developed well-nourished gentleman in no acute distress.  Alert and oriented x 3.  Ortho Exam unchanged right hip exam  Specialty Comments:  No specialty comments available.  Imaging: No new imaging   PMFS History: There are no active problems to display for this patient.  No past medical history on file.  Family History  Problem Relation Age of Onset   Diabetes Mother     Past Surgical History:  Procedure Laterality Date   SHOULDER SURGERY     Social History   Occupational History   Not on file  Tobacco Use   Smoking status: Never   Smokeless tobacco: Never  Vaping Use   Vaping status: Some Days  Substance and Sexual Activity   Alcohol use: Yes   Drug use: No   Sexual activity: Not on file        "

## 2024-08-21 NOTE — Telephone Encounter (Signed)
 Patient given surgical clearance form for PCP, Caleb Turmel. Aware that we must receive clearance form back before being able to proceed with scheduling surgery.

## 2024-08-28 ENCOUNTER — Other Ambulatory Visit: Payer: Self-pay

## 2024-08-28 ENCOUNTER — Ambulatory Visit: Admitting: Orthopaedic Surgery

## 2024-08-28 VITALS — Ht 74.0 in | Wt 255.0 lb

## 2024-08-28 DIAGNOSIS — M87051 Idiopathic aseptic necrosis of right femur: Secondary | ICD-10-CM

## 2024-08-28 NOTE — Progress Notes (Signed)
 "  Office Visit Note   Patient: Christopher Werner           Date of Birth: Apr 25, 1986           MRN: [id number] Visit Date: 08/28/2024              Requested by: Turmel, [place]aa, PA [address] [place],  [place] [address] PCP: Wonda Worth SQUIBB, PA   Assessment & Plan: Visit Diagnoses:  1. 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  Patient has attempted conservative treatment for at least 6 consecutive weeks within the past 12 weeks, including but not limited to physical therapy, home exercise program, NSAIDs, activity modification, and/or corticosteroid injections. Despite these efforts, symptoms have not improved or have worsened. Conservative measures have been deemed unsuccessful at this time. After a detailed discussion covering diagnosis and treatment options--including the risks, benefits, alternatives, and potential complications of surgical and nonsurgical management--the patient elected to proceed with surgery.  Current anticoagulants: No antithrombotic Postop anticoagulation: Aspirin 81 mg Diabetic: No  Prior DVT/PE: No Tobacco use: No Clearances needed for surgery: None Anticipate discharge dispo: home, outpatient surgery   Follow-Up Instructions: No follow-ups on file.   Orders:  Orders Placed This Encounter  Procedures   XR Pelvis 1-2 Views   No orders of the defined types were placed in this encounter.     Procedures: No procedures performed   Clinical Data: No additional findings.   Subjective: Chief Complaint  Patient presents with   Right Hip - Pain    HPI Christopher Werner is a 39 year old gentleman new referral from [facility] for surgical consultation for right hip avascular necrosis.  He reports severe pain.  Uses a cane for ambulation.  Denies any AVN risk factors. Review of Systems  Constitutional: Negative.   HENT: Negative.    Eyes:  Negative.   Respiratory: Negative.    Cardiovascular: Negative.   Gastrointestinal: Negative.   Endocrine: Negative.   Genitourinary: Negative.   Skin: Negative.   Allergic/Immunologic: Negative.   Neurological: Negative.   Hematological: Negative.   Psychiatric/Behavioral: Negative.    All other systems reviewed and are negative.    Objective: Vital Signs: Ht 6' 2 (1.88 m)   Wt 255 lb (115.7 kg)   BMI 32.74 kg/m   Physical Exam Vitals and nursing note reviewed.  Constitutional:      Appearance: He is well-developed.  HENT:     Head: Normocephalic and atraumatic.  Eyes:     Pupils: Pupils are equal, round, and reactive to light.  Pulmonary:     Effort: Pulmonary effort is normal.  Abdominal:     Palpations: Abdomen is soft.  Musculoskeletal:        General: Normal range of motion.     Cervical back: Neck supple.  Skin:    General: Skin is warm.  Neurological:     Mental Status: He is alert and oriented to person, place, and time.  Psychiatric:        Behavior: Behavior normal.        Thought Content: Thought content normal.        Judgment: Judgment normal.     Ortho Exam Examination right hip shows severe pain with hip manipulation.  Antalgic gait. Specialty Comments:  No specialty comments available.  Imaging: XR Pelvis 1-2 Views Result Date: 08/28/2024 X-rays of the pelvis  show sclerosis within the right femoral head consistent with avascular necrosis.    PMFS History: There are no active problems to display for this patient.  No past medical history on file.  Family History  Problem Relation Age of Onset   Diabetes Mother     Past Surgical History:  Procedure Laterality Date   SHOULDER SURGERY     Social History   Occupational History   Not on file  Tobacco Use   Smoking status: Never   Smokeless tobacco: Never  Vaping Use   Vaping status: Some Days  Substance and Sexual Activity   Alcohol use: Yes   Drug use: No   Sexual  activity: Not on file        "

## 2024-10-05 ENCOUNTER — Ambulatory Visit (HOSPITAL_COMMUNITY): Admit: 2024-10-05 | Admitting: Orthopaedic Surgery

## 2024-10-05 DIAGNOSIS — M87051 Idiopathic aseptic necrosis of right femur: Secondary | ICD-10-CM

## 2024-10-23 ENCOUNTER — Encounter: Admitting: Physician Assistant
